# Patient Record
Sex: Female | Born: 1945 | Hispanic: No
Health system: Southern US, Community
[De-identification: ages and names within clinical notes are randomized; demographics above are authoritative.]

---

## 1998-11-22 ENCOUNTER — Other Ambulatory Visit: Admission: RE | Admit: 1998-11-22 | Discharge: 1998-11-22 | Payer: Self-pay | Admitting: Obstetrics and Gynecology

## 1998-12-12 ENCOUNTER — Ambulatory Visit (HOSPITAL_COMMUNITY): Admission: RE | Admit: 1998-12-12 | Discharge: 1998-12-12 | Payer: Self-pay | Admitting: Obstetrics and Gynecology

## 1998-12-12 ENCOUNTER — Encounter: Payer: Self-pay | Admitting: Obstetrics and Gynecology

## 2000-02-18 ENCOUNTER — Other Ambulatory Visit: Admission: RE | Admit: 2000-02-18 | Discharge: 2000-02-18 | Payer: Self-pay | Admitting: Obstetrics and Gynecology

## 2000-02-19 ENCOUNTER — Encounter: Payer: Self-pay | Admitting: Obstetrics and Gynecology

## 2000-02-19 ENCOUNTER — Ambulatory Visit (HOSPITAL_COMMUNITY): Admission: RE | Admit: 2000-02-19 | Discharge: 2000-02-19 | Payer: Self-pay | Admitting: Obstetrics and Gynecology

## 2001-11-11 ENCOUNTER — Encounter: Admission: RE | Admit: 2001-11-11 | Discharge: 2002-02-09 | Payer: Self-pay | Admitting: *Deleted

## 2002-12-20 ENCOUNTER — Encounter: Payer: Self-pay | Admitting: Emergency Medicine

## 2002-12-20 ENCOUNTER — Inpatient Hospital Stay (HOSPITAL_COMMUNITY): Admission: EM | Admit: 2002-12-20 | Discharge: 2002-12-28 | Payer: Self-pay | Admitting: Emergency Medicine

## 2002-12-20 ENCOUNTER — Encounter (INDEPENDENT_AMBULATORY_CARE_PROVIDER_SITE_OTHER): Payer: Self-pay | Admitting: *Deleted

## 2002-12-26 ENCOUNTER — Encounter: Payer: Self-pay | Admitting: General Surgery

## 2003-03-29 ENCOUNTER — Ambulatory Visit (HOSPITAL_COMMUNITY): Admission: RE | Admit: 2003-03-29 | Discharge: 2003-03-29 | Payer: Self-pay | Admitting: *Deleted

## 2003-03-29 ENCOUNTER — Encounter: Payer: Self-pay | Admitting: *Deleted

## 2005-03-26 ENCOUNTER — Ambulatory Visit (HOSPITAL_COMMUNITY): Admission: RE | Admit: 2005-03-26 | Discharge: 2005-03-26 | Payer: Self-pay | Admitting: Internal Medicine

## 2006-04-24 ENCOUNTER — Ambulatory Visit (HOSPITAL_COMMUNITY): Admission: RE | Admit: 2006-04-24 | Discharge: 2006-04-24 | Payer: Self-pay | Admitting: Internal Medicine

## 2007-08-06 ENCOUNTER — Ambulatory Visit (HOSPITAL_COMMUNITY): Admission: RE | Admit: 2007-08-06 | Discharge: 2007-08-06 | Payer: Self-pay | Admitting: Family Medicine

## 2011-04-12 NOTE — Op Note (Signed)
NAME:  Kristen Bennett, Kristen Bennett                           ACCOUNT NO.:  [id number]   MEDICAL RECORD NO.:  [id number]                   PATIENT TYPE:  INP   LOCATION:  1843                                 FACILITY:  [facility]   PHYSICIAN:  Abigail Miyamoto, M.D.              DATE OF BIRTH:  1946/04/12   DATE OF PROCEDURE:  12/20/2002  DATE OF DISCHARGE:                                 OPERATIVE REPORT   PREOPERATIVE DIAGNOSIS:  Perforated appendix with abscess.   POSTOPERATIVE DIAGNOSIS:  Perforated appendix with abscess.   PROCEDURES:  1. Open appendectomy.  2. Drainage of retroperitoneal abscess.   SURGEON:  Abigail Miyamoto, M.D.   ANESTHESIA:  General endotracheal anesthesia.   ESTIMATED BLOOD LOSS:  Minimal.   INDICATIONS FOR PROCEDURE:  The patient is a 65 year old female who  presented with a week long history of diffuse abdominal pain which then  localized to the right lower quadrant.  She has had no fevers, chills,  nausea, or vomiting.  Has had a normal appetite.  CAT scan of the abdomen  and pelvis revealed what appeared to be an enlarged appendix with an abscess  and large phlegmon in the right lower quadrant without any other  abnormalities.  The patient's preoperative white blood count was 12,000.   FINDINGS:  The patient was found to have a ruptured appendix with phlegmon  involving the tip of the cecum with an abscess stuck into the  retroperitoneum.   PROCEDURE IN DETAIL:  The patient brought to the operating room, identified  as herself.  She was placed upon the operating table and general anesthesia  was induced.  The abdomen was then prepped and draped in the usual sterile  fashion.  Using a #10 blade a longitudinal incision was made in the  patient's right lower quadrant.  The incision was carried down through  Scarpa's fascia to the external oblique fascia with electrocautery.  The  external oblique fascia was then opened with a scalpel and further with  Metzenbaum  scissors.  The underlying muscle layers were then fully dissected  free.  The peritoneum was then identified and opened with the Metzenbaum  scissors.  Upon entering the abdomen, the patient was found to have a hard  phlegmon fixed in the retroperitoneum.  Further finger dissection broke into  an abscess cavity and a large amount of foul-smelling pus was expressed into  the incision.  Cultures were obtained from this.  At this point, the  incision was extended much further medially and laterally.  After exploring  the abdomen the patient was found to have a large fixed abscess with  phlegmon from the ruptured appendix and the retroperitoneum on the right  lower quadrant.  The rest of the cecum actually appeared quite healthy and  soft as well as the terminal ileum.  The cecum was mobilized along with the  right colon along the white line  with electrocautery.  Finger fracturing and  dissection with electrocautery was used and finally able to bring the  abscess and ruptured appendix up into the incision.  What appeared to be an  appendiceal artery was clamped and tied off with silk ties.  Further finger  fracturing freed up the veil of ______ from the rest of the appendix and  mesoappendix.  Once the cecum and the appendix were finally elevated into  the wound with the rest of the contained abscess, a TA60 was brought onto  the field and brought across the base of the appendix and cecum.  The  terminal ileum and the ileocecal valve appeared quite free from the staple  line.  The whole appendix and cecal tip were then completely transected with  the scalpel and the stapler.  The staple line was then oversewn with  interrupted 3-0 silk sutures.  The repair appeared to be intact with soft  healthy tissue around the suture line.  At this point, the retroperitoneum  was thoroughly irrigated with copious amounts of normal saline.  A separate  skin incision was made with a #10 blade scalpel and 19  [place] Blake drain  was placed into the retroperitoneum where the abscess had been.  The Wentworth  drain was then replaced with a nylon drain.  At this point, the rest of the  abdomen and the right lower quadrant was explored and again hemostasis  appeared to be achieved.  The posterior fascia and peritoneum was then  closed with running 1 Novofil suture.  The anterior fascia was then likewise  closed with a running 1 Novofil suture.  The skin was then thoroughly  irrigated more.  Several skin staples were then placed and wicks were placed  down into the wound to help with the drainage.  Dry gauze was placed over  this.  The patient tolerated the procedure well.  All sponge, instrument,  and needle counts were correct at the end of the procedure.  The patient was  then extubated in the operating room and taken in stable condition to the  recovery room.                                                 Abigail Miyamoto, M.D.    DB/MEDQ  D:  12/20/2002  T:  12/20/2002  Job:  [id number]

## 2011-04-12 NOTE — Discharge Summary (Signed)
NAME:  Kristen Bennett, Kristen Bennett                           ACCOUNT NO.:  [id number]   MEDICAL RECORD NO.:  [id number]                   PATIENT TYPE:  INP   LOCATION:  5733                                 FACILITY:  [facility]   PHYSICIAN:  Abigail Miyamoto, M.D.              DATE OF BIRTH:  [birth year]   DATE OF ADMISSION:  12/20/2002  DATE OF DISCHARGE:  12/28/2002                                 DISCHARGE SUMMARY   DISCHARGE DIAGNOSES:  1. Perforated appendix with abscess, status post open appendectomy.  2. Type 2 diabetes mellitus.  3. Hypertension.   SUMMARY OF HISTORY:  The patient is a 65 year old female who presented with  a one-week history of right lower quadrant abdominal pain which was  intermittent in nature but began to become more diffuse and began radiating  to the left lower quadrant.  She had no nausea or vomiting with this and was  moving her bowels well.  She presented to the emergency department, where  she underwent a CAT scan of the abdomen and pelvis which showed her to have  a possible ruptured appendix with abscess.  At that time, she had a white  blood count also of 12.3 and on examination, had marked tenderness in her  right lower quadrant.   HOSPITAL COURSE:  Given the findings on CAT scan, decision was made to  proceed to the operating room where she underwent an open appendectomy as  well as drainage of a retroperitoneal abscess and therefore a Blake drain  was left in the wound.  She tolerated the procedure well and was taken in  stable condition to the regular surgical floor.  She was continued on IV  antibiotics.  By postop day 1, she was tolerating sips.  Cultures were  obtained at the time of the procedure and these were followed.  Her Blake  drain was draining only serosanguineous fluid.  By postoperative day 2, she  was having some mild temperatures but remained otherwise hemodynamically  stable, with the wound remaining clean.  Over the next several days,  she  continued to have low-grade temperatures and her white blood count continued  to return to normal and she was continued on her normal antibiotic course.  Her ileus quickly resolved and she was advanced to a regular diet.  Her  diabetes was controlled with both her home oral medications and a on sliding  scale.  By postoperative day 6, her blood sugars still remained in the 159  to 260 range and her sliding scales of insulin were adjusted accordingly.  By postoperative day 7, she was continuing to have improvement in her bowel  movements and her drain remained minimal.  Followup CAT scan of the abdomen  and pelvis showed no abscess but a persistent phlegmon.  The pathology did  reveal appendicitis without any evidence of malignancy.  At this point, the  patient's Harrison Mons drain was  removed and she was converted to oral antibiotics.  By postoperative day 8, she was doing well, her abdomen was soft, she was  afebrile and decision was made to discharge the patient to home.   DISCHARGE MEDICATIONS:  She will resume all her home medications.  She is  going to take Percocet and Advil for pain.  She will take Augmentin 875 mg  b.i.d.   ACTIVITY:  She should do no heavy lifting greater than 20 pounds for 4 to 5  weeks.   WOUND CARE:  She may shower.    DISCHARGE FOLLOWUP:  She will follow up in the office of Dr. Abigail Miyamoto in two weeks post discharge.                                                Abigail Miyamoto, M.D.    DB/MEDQ  D:  02/01/2003  T:  02/02/2003  Job:  [id number]

## 2014-05-09 ENCOUNTER — Other Ambulatory Visit (HOSPITAL_COMMUNITY): Payer: Self-pay | Admitting: Family Medicine

## 2014-05-09 DIAGNOSIS — Z1231 Encounter for screening mammogram for malignant neoplasm of breast: Secondary | ICD-10-CM

## 2014-05-17 ENCOUNTER — Ambulatory Visit (HOSPITAL_COMMUNITY)
Admission: RE | Admit: 2014-05-17 | Discharge: 2014-05-17 | Disposition: A | Discharge status: Home / Self Care | Payer: BC Managed Care – PPO | Source: Ambulatory Visit | Attending: Family Medicine | Admitting: Family Medicine

## 2014-05-17 ENCOUNTER — Ambulatory Visit (HOSPITAL_COMMUNITY): Payer: Self-pay

## 2014-05-17 DIAGNOSIS — Z1231 Encounter for screening mammogram for malignant neoplasm of breast: Secondary | ICD-10-CM

## 2016-11-29 ENCOUNTER — Other Ambulatory Visit: Payer: Self-pay | Admitting: Family Medicine

## 2016-11-29 DIAGNOSIS — Z1231 Encounter for screening mammogram for malignant neoplasm of breast: Secondary | ICD-10-CM

## 2017-01-03 ENCOUNTER — Encounter: Payer: Medicare Other | Attending: Family Medicine | Admitting: Registered"

## 2017-01-03 ENCOUNTER — Ambulatory Visit
Admission: RE | Admit: 2017-01-03 | Discharge: 2017-01-03 | Disposition: A | Discharge status: Home / Self Care | Payer: Medicare Other | Source: Ambulatory Visit | Attending: Family Medicine | Admitting: Family Medicine

## 2017-01-03 DIAGNOSIS — Z713 Dietary counseling and surveillance: Secondary | ICD-10-CM | POA: Insufficient documentation

## 2017-01-03 DIAGNOSIS — E119 Type 2 diabetes mellitus without complications: Secondary | ICD-10-CM | POA: Insufficient documentation

## 2017-01-03 DIAGNOSIS — Z1231 Encounter for screening mammogram for malignant neoplasm of breast: Secondary | ICD-10-CM

## 2017-01-03 NOTE — Patient Instructions (Addendum)
Keep up the exercise Avoid skipping meals Oatmeal and nuts would be a good breakfast Include carbohydrate and protein with meals and snacks Check BG 2 hours after a meal (goal 100 -180)

## 2017-01-03 NOTE — Progress Notes (Signed)
Diabetes Self-Management Education  Visit Type: First/Initial  Appt. Start Time: 0800 Appt. End Time: 0915  01/03/2017  Ms. Kristen Bennett, identified by name and date of birth, is a 71 y.o. female with a diagnosis of Diabetes: Type 2.   ASSESSMENT Patient reports she started a low carb/intermittent fasting diet to lose weight 6 weeks ago. Diabetes medications include metformin, glimipiride, and also started Januvia recently. Because patient reports daily exercise and low food intake, stressed importance for regular meals and including some carb and protein with meals to avoid low blood sugar.      Diabetes Self-Management Education - 01/03/17 0826      Visit Information   Visit Type First/Initial     Initial Visit   Diabetes Type Type 2   Are you currently following a meal plan? Yes   What type of meal plan do you follow? last 6 weeks has been doing a low carb/intermittent fasting to lose wt   Are you taking your medications as prescribed? Yes   Date Diagnosed 16 yrs ago     Health Coping   How would you rate your overall health? Excellent     Psychosocial Assessment   Patient Belief/Attitude about Diabetes Motivated to manage diabetes   Preferred Learning Style No preference indicated   How often do you need to have someone help you when you read instructions, pamphlets, or other written materials from your doctor or pharmacy? 1 - Never   What is the last grade level you completed in school? 3 yrs of college     Complications   Last HgB A1C per patient/outside source 9.6 %  jan 2018   How often do you check your blood sugar? 1-2 times/day  before breakfast   Fasting Blood glucose range (mg/dL) --  [phone]   Number of hypoglycemic episodes per month 0  77 felt low per pt   Have you had a dilated eye exam in the past 12 months? No  has an appointment   Have you had a dental exam in the past 12 months? Yes   Are you checking your feet? Yes   How many days per week are you  checking your feet? 7     Dietary Intake   Breakfast boiled egg OR none   Lunch protein, vegetable   Snack (afternoon) nuts   Dinner protein, vegetable   Snack (evening) --  no food after 6 pm   Beverage(s) diet soda, coffee, water (1/2 gal per day)     Exercise   Exercise Type Light (walking / raking leaves)   How many days per week to you exercise? 7   How many minutes per day do you exercise? 30   Total minutes per week of exercise 210     Patient Education   Previous Diabetes Education Yes (please comment)  16 yrs ago DSME classes   Disease state  Definition of diabetes, type 1 and 2, and the diagnosis of diabetes   Nutrition management  Reviewed blood glucose goals for pre and post meals and how to evaluate the patients' food intake on their blood glucose level.;Meal timing in regards to the patients' current diabetes medication.   Physical activity and exercise  Role of exercise on diabetes management, blood pressure control and cardiac health.   Monitoring Identified appropriate SMBG and/or A1C goals.;Daily foot exams;Purpose and frequency of SMBG.;Yearly dilated eye exam   Acute complications Taught treatment of hypoglycemia - the 15 rule.   Chronic complications  Relationship between chronic complications and blood glucose control     Individualized Goals (developed by patient)   Nutrition General guidelines for healthy choices and portions discussed   Monitoring  test blood glucose pre and post meals as discussed     Outcomes   Expected Outcomes Demonstrated interest in learning. Expect positive outcomes   Future DMSE Other (comment)  1 week to continue initial education   Program Status Not Completed      Individualized Plan for Diabetes Self-Management Training:   Learning Objective:  Patient will have a greater understanding of diabetes self-management. Patient education plan is to attend individual and/or group sessions per assessed needs and concerns.   Plan:    Patient Instructions  Keep up the exercise Avoid skipping meals Oatmeal and nuts would be a good breakfast Include carbohydrate and protein with meals and snacks Check BG 2 hours after a meal (goal 100 -180)   Expected Outcomes:  Demonstrated interest in learning. Expect positive outcomes  Education material provided: Living Well with Diabetes copy  If problems or questions, patient to contact team via:  Phone and Email  Future DSME appointment: Other (comment) (1 week to continue initial education)

## 2017-01-10 ENCOUNTER — Encounter: Payer: Medicare Other | Admitting: Registered"

## 2017-01-10 DIAGNOSIS — Z713 Dietary counseling and surveillance: Secondary | ICD-10-CM | POA: Diagnosis not present

## 2017-01-10 DIAGNOSIS — E119 Type 2 diabetes mellitus without complications: Secondary | ICD-10-CM

## 2017-01-10 NOTE — Patient Instructions (Signed)
Plan:  Aim for 2-3 Carb Choices per meal (30-45 grams) +/- 1 either way  Aim for 0-1 Carbs per snack if hungry  Include protein in moderation with your meals and snacks Consider reading food labels for Total Carbohydrate Continue your activity level daily as tolerated Consider checking BG at alternate times per day as directed by MD  Continue taking medication as directed by MD

## 2017-01-10 NOTE — Progress Notes (Signed)
Visit Type: Follow-up to continue diabetes education  Appt. Start Time: 0800            Appt. End Time: 0900  01/10/2017  Ms. Kristen Bennett Bennett, identified by name and date of birth, is a 71 y.o. female with a diagnosis of Diabetes: Type 2.   ASSESSMENT Patient reports she started having oatmeal with walnuts for breakfast and feels much better. Will go in to see her doctor in April to have her A1c tested again.  Breakfast: oatmeal, walnuts Snack: nuts Lunch: 1 slice whole grain bread with meat or egg Dinner: shrimp, cauliflower, broccoli  Physical Activity: 30 min walking every day  Individualized Plan for Diabetes Self-Management Training:   Plan:  Aim for 2-3 Carb Choices per meal (30-45 grams) +/- 1 either way  Aim for 0-1 Carbs per snack if hungry  Include protein in moderation with your meals and snacks Consider reading food labels for Total Carbohydrate Continue your activity level daily as tolerated Consider checking BG at alternate times per day as directed by MD  Continue taking medication as directed by MD   Education material provided:   Meal Plan Card  Portion Plate  Snack Suggestions   If problems or questions, patient to contact team via:  Phone and Email  Future DSME appointment: PRN

## 2018-02-09 ENCOUNTER — Other Ambulatory Visit: Payer: Self-pay | Admitting: Family Medicine

## 2018-02-09 DIAGNOSIS — Z1231 Encounter for screening mammogram for malignant neoplasm of breast: Secondary | ICD-10-CM

## 2018-03-05 ENCOUNTER — Ambulatory Visit
Admission: RE | Admit: 2018-03-05 | Discharge: 2018-03-05 | Disposition: A | Discharge status: Home / Self Care | Payer: Medicare Other | Source: Ambulatory Visit | Attending: Family Medicine | Admitting: Family Medicine

## 2018-03-05 DIAGNOSIS — Z1231 Encounter for screening mammogram for malignant neoplasm of breast: Secondary | ICD-10-CM

## 2018-06-30 ENCOUNTER — Other Ambulatory Visit: Payer: Self-pay | Admitting: Family Medicine

## 2018-07-03 ENCOUNTER — Other Ambulatory Visit: Payer: Self-pay | Admitting: Family Medicine

## 2018-07-03 DIAGNOSIS — R42 Dizziness and giddiness: Secondary | ICD-10-CM

## 2018-07-03 DIAGNOSIS — I159 Secondary hypertension, unspecified: Secondary | ICD-10-CM

## 2018-07-14 ENCOUNTER — Ambulatory Visit
Admission: RE | Admit: 2018-07-14 | Discharge: 2018-07-14 | Disposition: A | Discharge status: Home / Self Care | Payer: Medicare Other | Source: Ambulatory Visit | Attending: Family Medicine | Admitting: Family Medicine

## 2018-07-14 DIAGNOSIS — I159 Secondary hypertension, unspecified: Secondary | ICD-10-CM

## 2018-07-14 DIAGNOSIS — R42 Dizziness and giddiness: Secondary | ICD-10-CM

## 2018-08-12 ENCOUNTER — Encounter: Payer: Self-pay | Admitting: Rehabilitative and Restorative Service Providers"

## 2018-08-12 ENCOUNTER — Ambulatory Visit: Payer: Medicare Other | Attending: Family Medicine | Admitting: Rehabilitative and Restorative Service Providers"

## 2018-08-12 DIAGNOSIS — R2689 Other abnormalities of gait and mobility: Secondary | ICD-10-CM | POA: Insufficient documentation

## 2018-08-12 DIAGNOSIS — H8112 Benign paroxysmal vertigo, left ear: Secondary | ICD-10-CM | POA: Insufficient documentation

## 2018-08-13 NOTE — Therapy (Signed)
[facility] [address] [place], [place], [address] Phone: [phone]   Fax:  [phone]  Physical Therapy Evaluation  Patient Details  Name: Kristen Bennett MRN: [id number] Date of Birth: [birth year] Referring Provider: Sigmund HazelLisa Miller, MD   Encounter Date: 08/12/2018  PT End of Session - 08/13/18 0912    Visit Number  1    Number of Visits  4    Date for PT Re-Evaluation  09/12/18    Authorization Type  $20 copay [employer] medicare    PT Start Time  1235    PT Stop Time  1315    PT Time Calculation (min)  40 min       History reviewed. No pertinent past medical history.  History reviewed. No pertinent surgical history.  There were no vitals filed for this visit.   Subjective Assessment - 08/12/18 1238    Subjective  The patient reports that she had a bout of vertigo 3.5 years ago that lasted for 1-2 days intermittently.  In the middle of June, she got a slight sensation of vertigo in the morning when getting up.  At the end of June, she had a more severe sensation of spinning in the morning when she first got up.  She was prescribed meclizine, which seems to help. Patient reports fear that dizziness can come on.    Symptoms are worse when moving to the left side.    Pertinent History  HTN, hypercolesterolemia, type II diabetes.      Patient Stated Goals  Get rid of this vertigo.  "I'm real careful about movements."    Currently in Pain?  No/denies         [facility] PT Assessment - 08/12/18 1242      Assessment   Medical Diagnosis  vertigo    Referring Provider  Sigmund HazelLisa Miller, MD    Onset Date/Surgical Date  --   04/2018   Prior Therapy  none      Precautions   Precautions  Fall      Restrictions   Weight Bearing Restrictions  No      Balance Screen   Has the patient fallen in the past 6 months  Yes    How many times?  1; fell 2 days before first onset and slipped on loose gravel and skinned knees and elbows "it  was a hard fall."    Has the patient had a decrease in activity level because of a fear of falling?   No    Is the patient reluctant to leave their home because of a fear of falling?   No      Home Environment   Living Environment  Private residence    Living Arrangements  Alone    Type of Home  House    Home Access  Level entry    Home Layout  One level      Prior Function   Level of Independence  Independent           Vestibular Assessment - 08/12/18 1244      Vestibular Assessment   General Observation  Patient notes some nausea/vomitting and sensation of falling with vertigo sensation.  No dizziness at rest.      Symptom Behavior   Type of Dizziness  Spinning    Frequency of Dizziness  heppens every day when getting into/out of bed    Duration of Dizziness  seconds    Aggravating Factors  Activity in  general;Turning head quickly;Supine to sit;Rolling to right;Rolling to left    Relieving Factors  Head stationary;Medication      Occulomotor Exam   Occulomotor Alignment  Normal    Spontaneous  Absent    Gaze-induced  Absent    Smooth Pursuits  Intact    Saccades  Intact      Vestibulo-Occular Reflex   VOR 1 Head Only (x 1 viewing)  slow gaze intact for visual fixation      Positional Testing   Dix-Hallpike  Dix-Hallpike Right;Dix-Hallpike Left    Horizontal Canal Testing  Horizontal Canal Right;Horizontal Canal Left      Dix-Hallpike Right   Dix-Hallpike Right Duration  0    Dix-Hallpike Right Symptoms  No nystagmus      Dix-Hallpike Left   Dix-Hallpike Left Duration  10    Dix-Hallpike Left Symptoms  Upbeat, left rotatory nystagmus   large amplitude nystagmus     Horizontal Canal Right   Horizontal Canal Right Duration  0    Horizontal Canal Right Symptoms  Normal      Horizontal Canal Left   Horizontal Canal Left Duration  0    Horizontal Canal Left Symptoms  Normal          Objective measurements completed on examination: See above findings.        Vestibular Treatment/Exercise - 08/12/18 1252      Vestibular Treatment/Exercise   Vestibular Treatment Provided  Canalith Repositioning    Canalith Repositioning  Epley Manuever Left            PT Education - 08/13/18 0911    Education Details  nature of BPPV, handout from [email or web address]    Person(s) Educated  Patient    Methods  Explanation;Handout    Comprehension  Verbalized understanding          PT Long Term Goals - 08/13/18 0912      PT LONG TERM GOAL #1   Title  The patient will have negative position testing indicating resolution of BPPv.    Time  4    Period  Weeks    Target Date  09/12/18      PT LONG TERM GOAL #2   Title  The patient will subjectively report ability to perform bed mobility without vertigo.    Time  4    Period  Weeks    Target Date  09/12/18      PT LONG TERM GOAL #3   Title  The patient will be instructed in home mgmt of positional vertigo.    Time  4    Period  Weeks    Target Date  09/12/18      PT LONG TERM GOAL #4   Title  The patient will be further assessed for gait/balance once BPPv cleared and exercises to follow, if indicated.    Time  4    Period  Weeks    Target Date  09/12/18             Plan - 08/13/18 0913    Clinical Impression Statement  The patient is a 72 year old female presenting to OP phsyical therapy with left posterior canal BPPV responding well to Epley's canolith repositioning today.  PT to f/u for reassessment to ensure resolution of vertigo and further assessment of balance/gait if indicated.     History and Personal Factors relevant to plan of care:  HTN, hypercolesterolemia, type II diabetes.      Clinical Presentation  Stable  Clinical Decision Making  Low    Rehab Potential  Good    PT Frequency  1x / week    PT Duration  4 weeks    PT Treatment/Interventions  ADLs/Self Care Home Management;Balance training;Neuromuscular re-education;Vestibular;Canalith Repostioning;Gait  training;Stair training;Functional mobility training;Therapeutic activities;Therapeutic exercise;Patient/family education    PT Next Visit Plan  reassess for positional vertigo, treat as indicated, home mgmt, assess balance if needed.    Consulted and Agree with Plan of Care  Patient       Patient will benefit from skilled therapeutic intervention in order to improve the following deficits and impairments:  Abnormal gait, Dizziness, Decreased balance, Decreased activity tolerance  Visit Diagnosis: BPPV (benign paroxysmal positional vertigo), left  Other abnormalities of gait and mobility     Problem List There are no active problems to display for this patient.   Ely Spragg, PT 08/13/2018, 9:15 AM  [facility] [address] [place], [place], [address] Phone: [phone]   Fax:  [phone]  Name: Kristen Bennett MRN: [id number] Date of Birth: Dec 09, 1945

## 2018-08-17 ENCOUNTER — Ambulatory Visit: Payer: Medicare Other | Admitting: Rehabilitative and Restorative Service Providers"

## 2018-08-24 ENCOUNTER — Encounter: Payer: Self-pay | Admitting: Rehabilitative and Restorative Service Providers"

## 2018-08-24 ENCOUNTER — Ambulatory Visit: Payer: Medicare Other | Admitting: Rehabilitative and Restorative Service Providers"

## 2018-08-24 DIAGNOSIS — H8112 Benign paroxysmal vertigo, left ear: Secondary | ICD-10-CM

## 2018-08-24 DIAGNOSIS — R2689 Other abnormalities of gait and mobility: Secondary | ICD-10-CM

## 2018-08-24 NOTE — Therapy (Signed)
[facility] [address] [place], [place], [address] Phone: [phone]   Fax:  [phone]  Physical Therapy Treatment and Discharge Summary  Patient Details  Name: Kristen Bennett MRN: [id number] Date of Birth: [birth year] Referring Provider (PT): Kathyrn Lass, MD   Encounter Date: 08/24/2018  PT End of Session - 08/24/18 1124    Visit Number  2    Number of Visits  4    Date for PT Re-Evaluation  09/12/18    Authorization Type  $20 copay [employer] medicare    PT Start Time  1100    PT Stop Time  1118    PT Time Calculation (min)  18 min    Activity Tolerance  Patient tolerated treatment well    Behavior During Therapy  [facility] for tasks assessed/performed       History reviewed. No pertinent past medical history.  History reviewed. No pertinent surgical history.  There were no vitals filed for this visit.  Subjective Assessment - 08/24/18 1104    Subjective  The patient reports no recurrence of vertigo since evaluation.    She reports she is still careful or cautious with movement due to developing a pattern.  She has a cold and is concerned this may influence how she feels today.    Patient Stated Goals  Get rid of this vertigo.  "I'm real careful about movements."    Currently in Pain?  No/denies             Vestibular Assessment - 08/24/18 1124      Positional Testing   Dix-Hallpike  Dix-Hallpike Right;Dix-Hallpike Left      Dix-Hallpike Left   Dix-Hallpike Left Duration  0    Dix-Hallpike Left Symptoms  No nystagmus               [facility] Adult PT Treatment/Exercise - 08/24/18 1125      Self-Care   Self-Care  Other Self-Care Comments    Other Self-Care Comments   Educated patient on brandt daroff habituation exercises, how it works and frequency needed to perform.  Also discussed to only begin if symptoms return.  Patient verbalizes understanding.                  PT Long Term Goals  - 08/24/18 1126      PT LONG TERM GOAL #1   Title  The patient will have negative position testing indicating resolution of BPPv.    Time  4    Period  Weeks    Status  Achieved      PT LONG TERM GOAL #2   Title  The patient will subjectively report ability to perform bed mobility without vertigo.    Time  4    Period  Weeks    Status  Achieved      PT LONG TERM GOAL #3   Title  The patient will be instructed in home mgmt of positional vertigo.    Time  4    Period  Weeks    Status  Achieved      PT LONG TERM GOAL #4   Title  The patient will be further assessed for gait/balance once BPPv cleared and exercises to follow, if indicated.    Time  4    Period  Weeks    Status  Deferred            Plan - 08/24/18 1126    Clinical Impression Statement  The patient met LTGs noting no further dizziness since initial treatment.  PT provided instruction in self mgmt of symptoms due to h/o 2 prior episodes.     PT Treatment/Interventions  ADLs/Self Care Home Management;Balance training;Neuromuscular re-education;Vestibular;Canalith Repostioning;Gait training;Stair training;Functional mobility training;Therapeutic activities;Therapeutic exercise;Patient/family education    PT Next Visit Plan  Discharge today- symptoms resolved    Consulted and Agree with Plan of Care  Patient       Patient will benefit from skilled therapeutic intervention in order to improve the following deficits and impairments:  Abnormal gait, Dizziness, Decreased balance, Decreased activity tolerance  Visit Diagnosis: BPPV (benign paroxysmal positional vertigo), left  Other abnormalities of gait and mobility    PHYSICAL THERAPY DISCHARGE SUMMARY  Visits from Start of Care: 2  Current functional level related to goals / functional outcomes: See above   Remaining deficits: None    Education / Equipment: HEP on self mgmt of BPPv  Plan: Patient agrees to discharge.  Patient goals were met. Patient  is being discharged due to meeting the stated rehab goals.  ?????        Thank you for the referral of this patient. Rudell Cobb, MPT   [place], PT 08/24/2018, 11:26 AM  [facility] [address] [place], [place], [address] Phone: [phone]   Fax:  [phone]  Name: PRESLEIGH FELDSTEIN MRN: [id number] Date of Birth: 24-Aug-1946

## 2018-08-24 NOTE — Patient Instructions (Signed)
Habituation - Tip Card  1.The goal of habituation training is to assist in decreasing symptoms of vertigo, dizziness, or nausea provoked by specific head and body motions. 2.These exercises may initially increase symptoms; however, be persistent and work through symptoms. With repetition and time, the exercises will assist in reducing or eliminating symptoms. 3.Exercises should be stopped and discussed with the therapist if you experience any of the following: - Sudden change or fluctuation in hearing - New onset of ringing in the ears, or increase in current intensity - Any fluid discharge from the ear - Severe pain in neck or back - Extreme nausea  Copyright  VHI. All rights reserved.   Habituation - Sit to Side-Lying   Sit on edge of bed. Lie down onto the right side and hold until dizziness stops, plus 20 seconds.  Return to sitting and wait until dizziness stops, plus 20 seconds.  Repeat to the left side. Repeat sequence 5 times per session. Do 2 sessions per day.  Copyright  VHI. All rights reserved.     

## 2019-04-16 ENCOUNTER — Other Ambulatory Visit: Payer: Self-pay | Admitting: Family Medicine

## 2019-04-16 DIAGNOSIS — Z1231 Encounter for screening mammogram for malignant neoplasm of breast: Secondary | ICD-10-CM

## 2019-05-14 ENCOUNTER — Other Ambulatory Visit: Payer: Self-pay

## 2019-05-14 ENCOUNTER — Ambulatory Visit
Admission: RE | Admit: 2019-05-14 | Discharge: 2019-05-14 | Disposition: A | Discharge status: Home / Self Care | Payer: Medicare Other | Source: Ambulatory Visit | Attending: Family Medicine | Admitting: Family Medicine

## 2019-05-14 DIAGNOSIS — Z1231 Encounter for screening mammogram for malignant neoplasm of breast: Secondary | ICD-10-CM

## 2020-04-26 ENCOUNTER — Other Ambulatory Visit: Payer: Self-pay | Admitting: Family Medicine

## 2020-04-26 DIAGNOSIS — Z1231 Encounter for screening mammogram for malignant neoplasm of breast: Secondary | ICD-10-CM

## 2020-05-17 ENCOUNTER — Ambulatory Visit
Admission: RE | Admit: 2020-05-17 | Discharge: 2020-05-17 | Disposition: A | Discharge status: Home / Self Care | Payer: Medicare Other | Source: Ambulatory Visit | Attending: Family Medicine | Admitting: Family Medicine

## 2020-05-17 ENCOUNTER — Other Ambulatory Visit: Payer: Self-pay

## 2020-05-17 DIAGNOSIS — Z1231 Encounter for screening mammogram for malignant neoplasm of breast: Secondary | ICD-10-CM

## 2020-07-05 DIAGNOSIS — Z79899 Other long term (current) drug therapy: Secondary | ICD-10-CM | POA: Diagnosis not present

## 2020-07-05 DIAGNOSIS — B351 Tinea unguium: Secondary | ICD-10-CM | POA: Diagnosis not present

## 2020-08-13 DIAGNOSIS — Z20828 Contact with and (suspected) exposure to other viral communicable diseases: Secondary | ICD-10-CM | POA: Diagnosis not present

## 2020-08-15 ENCOUNTER — Other Ambulatory Visit (HOSPITAL_COMMUNITY): Payer: Self-pay | Admitting: Nurse Practitioner

## 2020-08-15 DIAGNOSIS — U071 COVID-19: Secondary | ICD-10-CM

## 2020-08-15 NOTE — Progress Notes (Signed)
I connected by phone with Kristen Bennett on 08/15/2020 at 7:06 PM to discuss the potential use of a new treatment for mild to moderate COVID-19 viral infection in non-hospitalized patients.  This patient is a 74 y.o. female that meets the FDA criteria for Emergency Use Authorization of COVID monoclonal antibody casirivimab/imdevimab.  Has a (+) direct SARS-CoV-2 viral test result  Has mild or moderate COVID-19   Is NOT hospitalized due to COVID-19  Is within 10 days of symptom onset  Has at least one of the high risk factor(s) for progression to severe COVID-19 and/or hospitalization as defined in EUA.  Specific high risk criteria : Diabetes   I have spoken and communicated the following to the patient or parent/caregiver regarding COVID monoclonal antibody treatment:  1. FDA has authorized the emergency use for the treatment of mild to moderate COVID-19 in adults and pediatric patients with positive results of direct SARS-CoV-2 viral testing who are 47 years of age and older weighing at least 40 kg, and who are at high risk for progressing to severe COVID-19 and/or hospitalization.  2. The significant known and potential risks and benefits of COVID monoclonal antibody, and the extent to which such potential risks and benefits are unknown.  3. Information on available alternative treatments and the risks and benefits of those alternatives, including clinical trials.  4. Patients treated with COVID monoclonal antibody should continue to self-isolate and use infection control measures (e.g., wear mask, isolate, social distance, avoid sharing personal items, clean and disinfect "high touch" surfaces, and frequent handwashing) according to CDC guidelines.   5. The patient or parent/caregiver has the option to accept or refuse COVID monoclonal antibody treatment.  After reviewing this information with the patient, The patient agreed to proceed with receiving casirivimab\imdevimab infusion and  will be provided a copy of the Fact sheet prior to receiving the infusion. Infusion scheduled for September 22 at 3:30pm.   Trinidad Curet, NP 08/15/2020 7:06 PM

## 2020-08-16 ENCOUNTER — Ambulatory Visit (HOSPITAL_COMMUNITY)
Admission: RE | Admit: 2020-08-16 | Discharge: 2020-08-16 | Disposition: A | Discharge status: Home / Self Care | Payer: Medicare Other | Source: Ambulatory Visit | Attending: Pulmonary Disease | Admitting: Pulmonary Disease

## 2020-08-16 ENCOUNTER — Other Ambulatory Visit (HOSPITAL_COMMUNITY): Payer: Self-pay

## 2020-08-16 DIAGNOSIS — Z23 Encounter for immunization: Secondary | ICD-10-CM | POA: Insufficient documentation

## 2020-08-16 DIAGNOSIS — U071 COVID-19: Secondary | ICD-10-CM

## 2020-08-16 MED ORDER — SODIUM CHLORIDE 0.9 % IV SOLN
1200.0000 mg | Freq: Once | INTRAVENOUS | Status: AC
  Administered 2020-08-16: 1200 mg via INTRAVENOUS

## 2020-08-16 MED ORDER — DIPHENHYDRAMINE HCL 50 MG/ML IJ SOLN: 50.0000 mg | Freq: Once | INTRAMUSCULAR | Status: DC | PRN

## 2020-08-16 MED ORDER — METHYLPREDNISOLONE SODIUM SUCC 125 MG IJ SOLR: 125.0000 mg | Freq: Once | INTRAMUSCULAR | Status: DC | PRN

## 2020-08-16 MED ORDER — FAMOTIDINE IN NACL 20-0.9 MG/50ML-% IV SOLN: 20.0000 mg | Freq: Once | INTRAVENOUS | Status: DC | PRN

## 2020-08-16 MED ORDER — SODIUM CHLORIDE 0.9 % IV SOLN: INTRAVENOUS | Status: DC | PRN

## 2020-08-16 MED ORDER — ALBUTEROL SULFATE HFA 108 (90 BASE) MCG/ACT IN AERS: 2.0000 | INHALATION_SPRAY | Freq: Once | RESPIRATORY_TRACT | Status: DC | PRN

## 2020-08-16 MED ORDER — EPINEPHRINE 0.3 MG/0.3ML IJ SOAJ: 0.3000 mg | Freq: Once | INTRAMUSCULAR | Status: DC | PRN

## 2020-08-16 NOTE — Discharge Instructions (Signed)

## 2020-08-16 NOTE — Progress Notes (Signed)
  Diagnosis: COVID-19  Physician: Dr. Patrick Wright  Procedure: [facility] Infusion [facility] Med: casirivimab\imdevimab infusion - Provided patient with casirivimab\imdevimab fact sheet for patients, parents and caregivers prior to infusion.  Complications: No immediate complications noted.  Discharge: Discharged home   Kalsey Lull H Anberlyn Feimster 08/16/2020   

## 2020-10-04 DIAGNOSIS — E1169 Type 2 diabetes mellitus with other specified complication: Secondary | ICD-10-CM | POA: Diagnosis not present

## 2020-10-04 DIAGNOSIS — Z780 Asymptomatic menopausal state: Secondary | ICD-10-CM | POA: Diagnosis not present

## 2020-10-04 DIAGNOSIS — Z6836 Body mass index (BMI) 36.0-36.9, adult: Secondary | ICD-10-CM | POA: Diagnosis not present

## 2020-10-04 DIAGNOSIS — E782 Mixed hyperlipidemia: Secondary | ICD-10-CM | POA: Diagnosis not present

## 2020-10-04 DIAGNOSIS — Z0000 Encounter for general adult medical examination without abnormal findings: Secondary | ICD-10-CM | POA: Diagnosis not present

## 2020-10-04 DIAGNOSIS — I10 Essential (primary) hypertension: Secondary | ICD-10-CM | POA: Diagnosis not present

## 2020-10-27 ENCOUNTER — Other Ambulatory Visit: Payer: Self-pay | Admitting: Family Medicine

## 2020-10-31 DIAGNOSIS — I10 Essential (primary) hypertension: Secondary | ICD-10-CM | POA: Diagnosis not present

## 2020-10-31 DIAGNOSIS — E1169 Type 2 diabetes mellitus with other specified complication: Secondary | ICD-10-CM | POA: Diagnosis not present

## 2020-10-31 DIAGNOSIS — E782 Mixed hyperlipidemia: Secondary | ICD-10-CM | POA: Diagnosis not present

## 2020-12-08 DIAGNOSIS — M533 Sacrococcygeal disorders, not elsewhere classified: Secondary | ICD-10-CM | POA: Diagnosis not present

## 2020-12-08 DIAGNOSIS — Z6836 Body mass index (BMI) 36.0-36.9, adult: Secondary | ICD-10-CM | POA: Diagnosis not present

## 2021-01-23 DIAGNOSIS — H2513 Age-related nuclear cataract, bilateral: Secondary | ICD-10-CM | POA: Diagnosis not present

## 2021-01-23 DIAGNOSIS — H524 Presbyopia: Secondary | ICD-10-CM | POA: Diagnosis not present

## 2021-03-30 DIAGNOSIS — E559 Vitamin D deficiency, unspecified: Secondary | ICD-10-CM | POA: Diagnosis not present

## 2021-03-30 DIAGNOSIS — R946 Abnormal results of thyroid function studies: Secondary | ICD-10-CM | POA: Diagnosis not present

## 2021-03-30 DIAGNOSIS — E1169 Type 2 diabetes mellitus with other specified complication: Secondary | ICD-10-CM | POA: Diagnosis not present

## 2021-03-30 DIAGNOSIS — E782 Mixed hyperlipidemia: Secondary | ICD-10-CM | POA: Diagnosis not present

## 2021-03-30 DIAGNOSIS — E538 Deficiency of other specified B group vitamins: Secondary | ICD-10-CM | POA: Diagnosis not present

## 2021-03-30 DIAGNOSIS — I10 Essential (primary) hypertension: Secondary | ICD-10-CM | POA: Diagnosis not present

## 2021-03-30 DIAGNOSIS — Z6836 Body mass index (BMI) 36.0-36.9, adult: Secondary | ICD-10-CM | POA: Diagnosis not present

## 2021-05-14 ENCOUNTER — Other Ambulatory Visit: Payer: Self-pay | Admitting: Family Medicine

## 2021-05-16 ENCOUNTER — Other Ambulatory Visit: Payer: Self-pay | Admitting: Family Medicine

## 2021-05-16 DIAGNOSIS — Z1231 Encounter for screening mammogram for malignant neoplasm of breast: Secondary | ICD-10-CM

## 2021-05-16 DIAGNOSIS — Z780 Asymptomatic menopausal state: Secondary | ICD-10-CM

## 2021-06-26 ENCOUNTER — Ambulatory Visit: Payer: Medicare Other

## 2021-06-26 ENCOUNTER — Ambulatory Visit
Admission: RE | Admit: 2021-06-26 | Discharge: 2021-06-26 | Disposition: A | Discharge status: Home / Self Care | Payer: Medicare Other | Source: Ambulatory Visit | Attending: Family Medicine | Admitting: Family Medicine

## 2021-06-26 DIAGNOSIS — M85832 Other specified disorders of bone density and structure, left forearm: Secondary | ICD-10-CM | POA: Diagnosis not present

## 2021-06-26 DIAGNOSIS — Z780 Asymptomatic menopausal state: Secondary | ICD-10-CM | POA: Diagnosis not present

## 2021-06-27 ENCOUNTER — Other Ambulatory Visit: Payer: Self-pay

## 2021-08-16 ENCOUNTER — Ambulatory Visit
Admission: RE | Admit: 2021-08-16 | Discharge: 2021-08-16 | Disposition: A | Discharge status: Home / Self Care | Payer: Medicare PPO | Source: Ambulatory Visit | Attending: Family Medicine | Admitting: Family Medicine

## 2021-08-16 ENCOUNTER — Other Ambulatory Visit: Payer: Self-pay

## 2021-08-16 DIAGNOSIS — Z1231 Encounter for screening mammogram for malignant neoplasm of breast: Secondary | ICD-10-CM | POA: Diagnosis not present

## 2021-10-02 DIAGNOSIS — I10 Essential (primary) hypertension: Secondary | ICD-10-CM | POA: Diagnosis not present

## 2021-10-02 DIAGNOSIS — E782 Mixed hyperlipidemia: Secondary | ICD-10-CM | POA: Diagnosis not present

## 2021-10-02 DIAGNOSIS — E1169 Type 2 diabetes mellitus with other specified complication: Secondary | ICD-10-CM | POA: Diagnosis not present

## 2021-12-31 DIAGNOSIS — Z1389 Encounter for screening for other disorder: Secondary | ICD-10-CM | POA: Diagnosis not present

## 2021-12-31 DIAGNOSIS — Z0000 Encounter for general adult medical examination without abnormal findings: Secondary | ICD-10-CM | POA: Diagnosis not present

## 2022-01-25 DIAGNOSIS — H2513 Age-related nuclear cataract, bilateral: Secondary | ICD-10-CM | POA: Diagnosis not present

## 2022-01-25 DIAGNOSIS — H524 Presbyopia: Secondary | ICD-10-CM | POA: Diagnosis not present

## 2022-01-25 DIAGNOSIS — E119 Type 2 diabetes mellitus without complications: Secondary | ICD-10-CM | POA: Diagnosis not present

## 2022-04-01 DIAGNOSIS — E1169 Type 2 diabetes mellitus with other specified complication: Secondary | ICD-10-CM | POA: Diagnosis not present

## 2022-04-01 DIAGNOSIS — I10 Essential (primary) hypertension: Secondary | ICD-10-CM | POA: Diagnosis not present

## 2022-04-01 DIAGNOSIS — R1084 Generalized abdominal pain: Secondary | ICD-10-CM | POA: Diagnosis not present

## 2022-04-01 DIAGNOSIS — E782 Mixed hyperlipidemia: Secondary | ICD-10-CM | POA: Diagnosis not present

## 2022-04-01 DIAGNOSIS — Z6834 Body mass index (BMI) 34.0-34.9, adult: Secondary | ICD-10-CM | POA: Diagnosis not present

## 2022-08-23 ENCOUNTER — Other Ambulatory Visit: Payer: Self-pay | Admitting: Family Medicine

## 2022-08-23 DIAGNOSIS — Z1231 Encounter for screening mammogram for malignant neoplasm of breast: Secondary | ICD-10-CM

## 2022-09-09 ENCOUNTER — Ambulatory Visit
Admission: RE | Admit: 2022-09-09 | Discharge: 2022-09-09 | Disposition: A | Discharge status: Home / Self Care | Payer: Medicare PPO | Source: Ambulatory Visit | Attending: Family Medicine | Admitting: Family Medicine

## 2022-09-09 DIAGNOSIS — Z1231 Encounter for screening mammogram for malignant neoplasm of breast: Secondary | ICD-10-CM

## 2022-09-25 IMAGING — MG MM DIGITAL SCREENING BILAT W/ TOMO AND CAD
8 series · 8 of 24 positions shown · non-contrast
Comparison: Previous exam(s).

CLINICAL DATA: Screening.

EXAM:
DIGITAL SCREENING BILATERAL MAMMOGRAM WITH TOMOSYNTHESIS AND CAD
TECHNIQUE: Bilateral screening digital craniocaudal and mediolateral oblique
mammograms were obtained. Bilateral screening digital breast
tomosynthesis was performed. The images were evaluated with
computer-aided detection.

[R CC synth-2D]
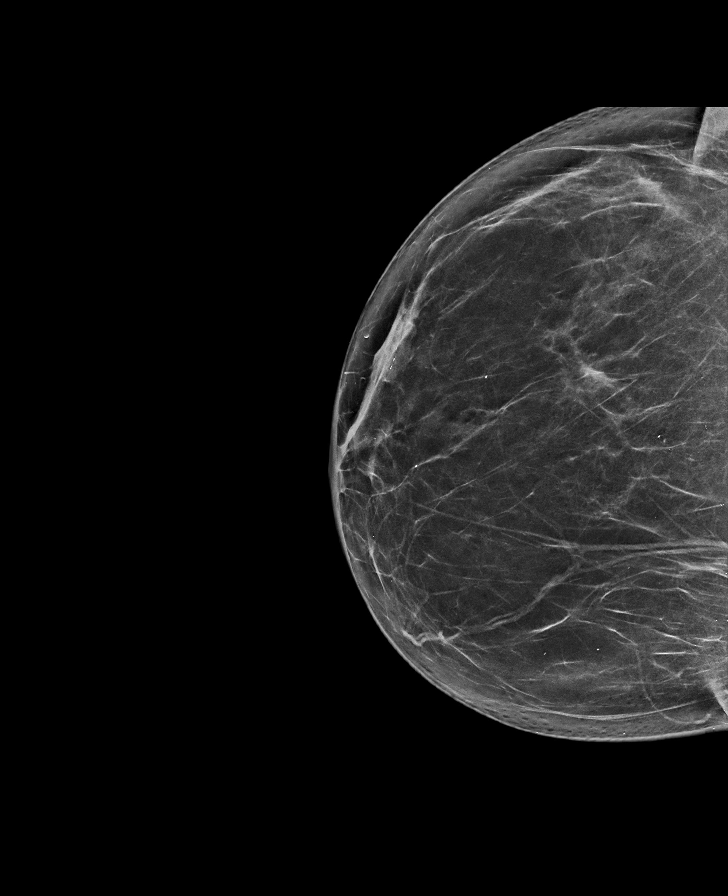

[L CC synth-2D]
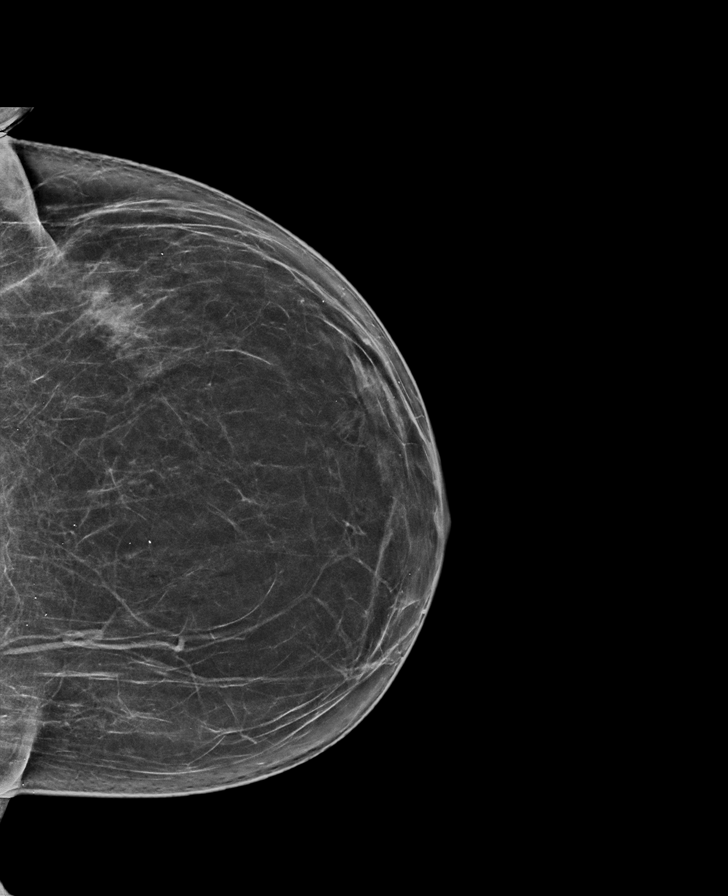

[L MLO synth-2D]
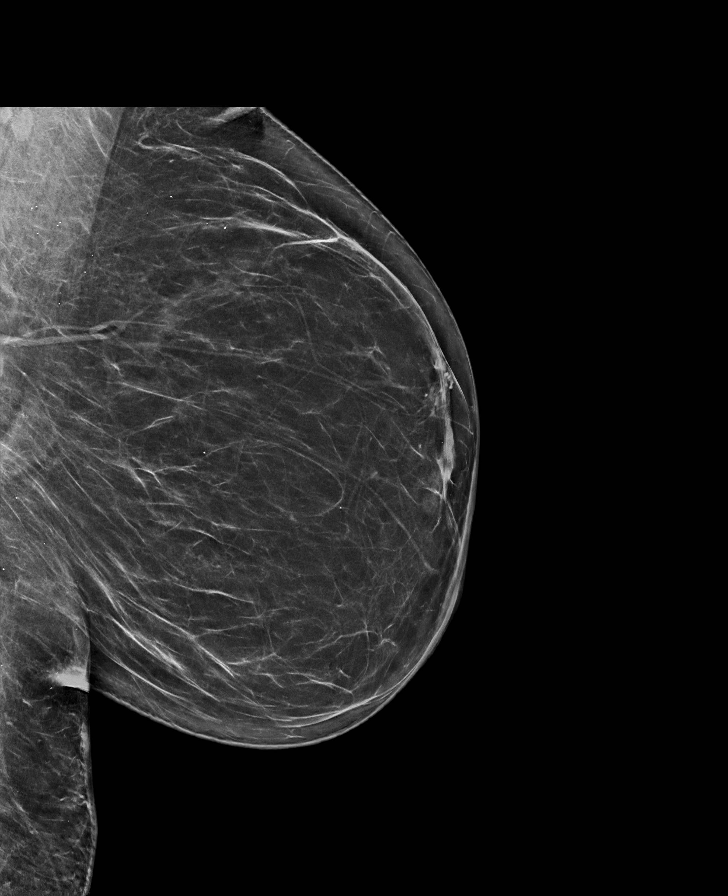

[R MLO synth-2D]
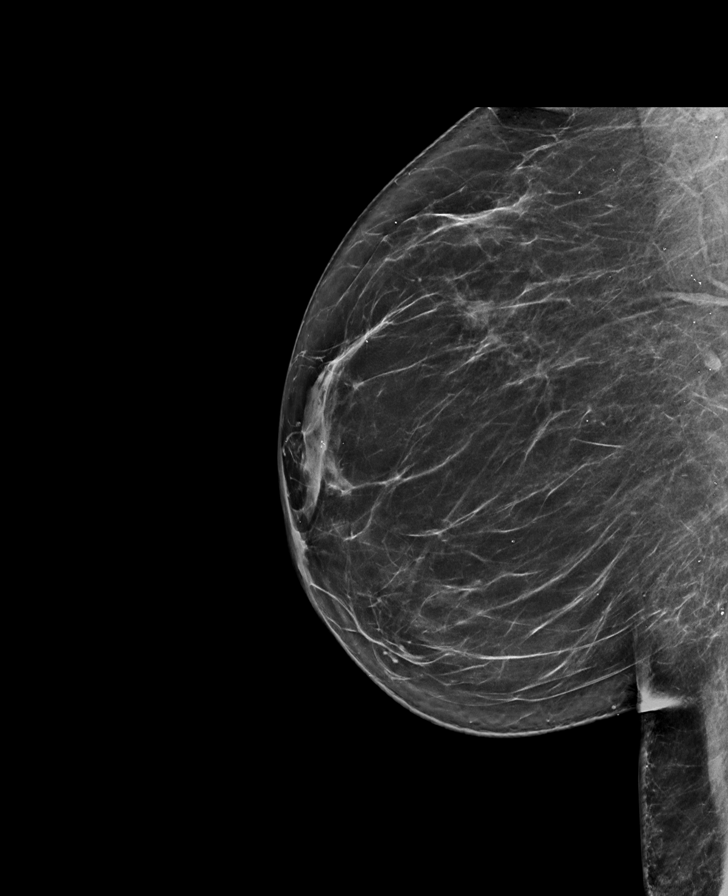

[L CC tomo · tomo slice 34/67.0]
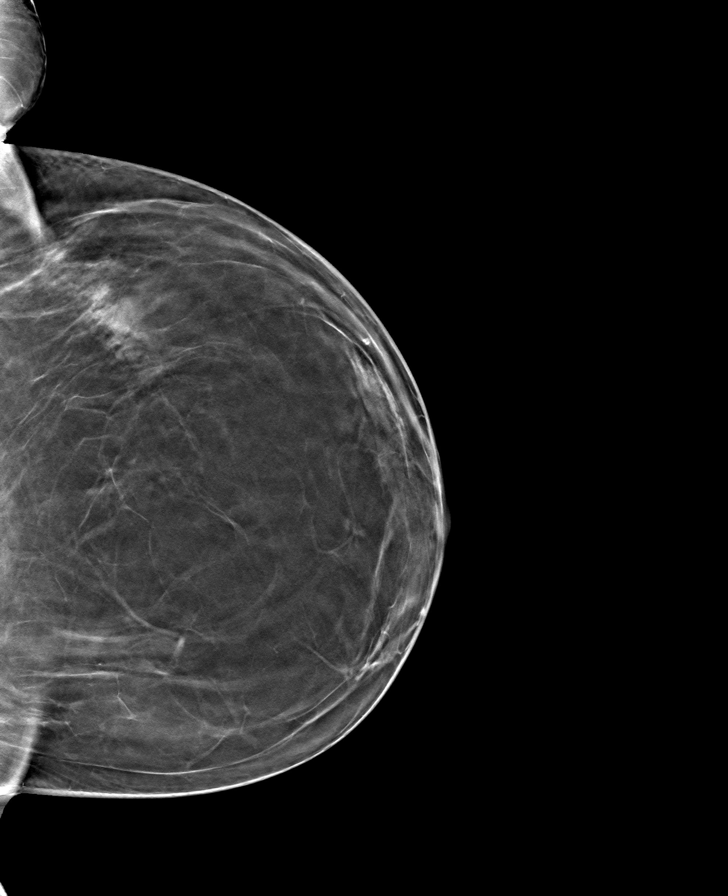

[R MLO tomo · tomo slice 37/74.0]
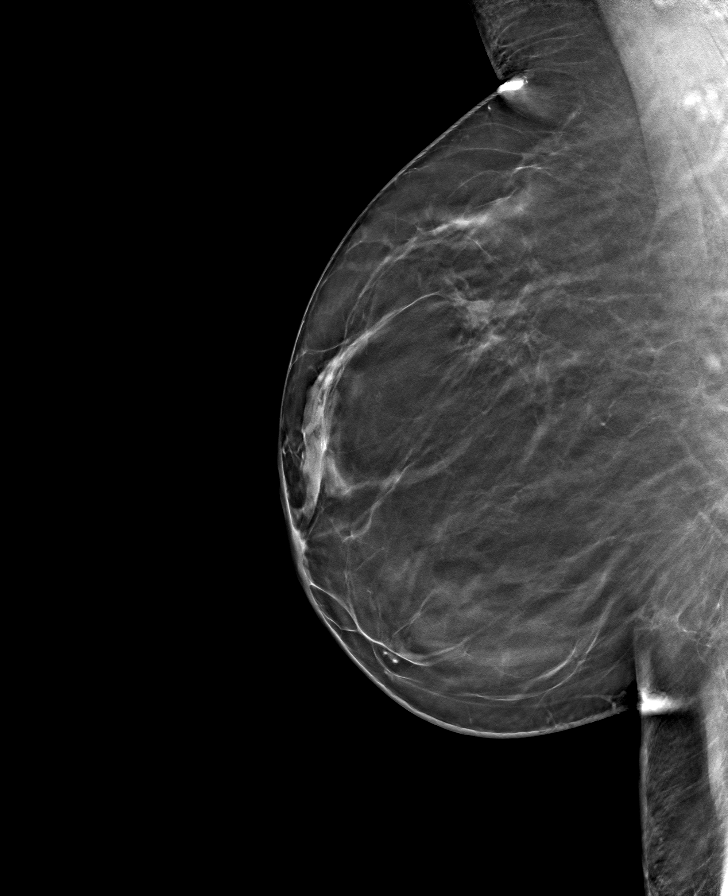

[R CC tomo · tomo slice 36/71.0]
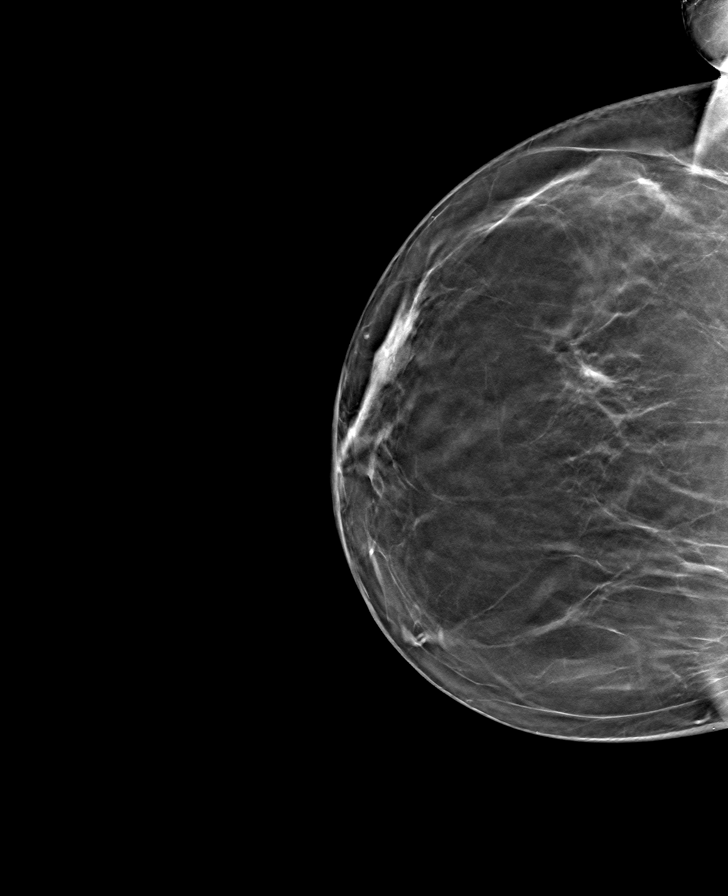

[L MLO tomo · tomo slice 35/70.0]
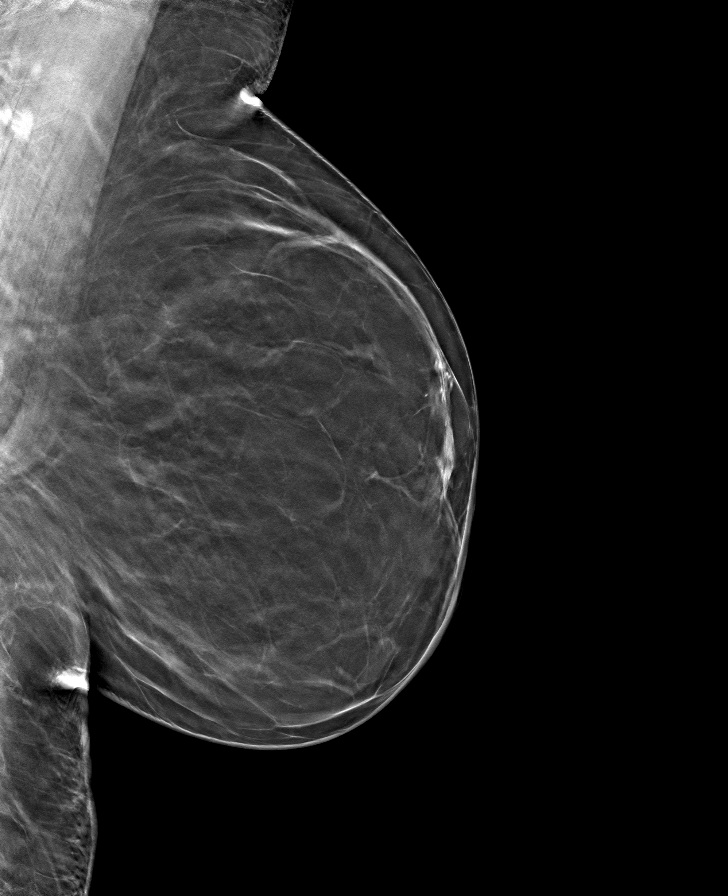

[8 of 24 positions shown; findings below may reference images not displayed]

ACR Breast Density Category b: There are scattered areas of
fibroglandular density.
FINDINGS: There are no findings suspicious for malignancy.
IMPRESSION: No mammographic evidence of malignancy. A result letter of this
screening mammogram will be mailed directly to the patient.

RECOMMENDATION:
Screening mammogram in one year. (Code:51-O-LD2)

BI-RADS CATEGORY  1: Negative.

## 2022-10-07 DIAGNOSIS — Z6833 Body mass index (BMI) 33.0-33.9, adult: Secondary | ICD-10-CM | POA: Diagnosis not present

## 2022-10-07 DIAGNOSIS — E782 Mixed hyperlipidemia: Secondary | ICD-10-CM | POA: Diagnosis not present

## 2022-10-07 DIAGNOSIS — E1169 Type 2 diabetes mellitus with other specified complication: Secondary | ICD-10-CM | POA: Diagnosis not present

## 2022-10-07 DIAGNOSIS — I10 Essential (primary) hypertension: Secondary | ICD-10-CM | POA: Diagnosis not present

## 2022-10-07 DIAGNOSIS — Z23 Encounter for immunization: Secondary | ICD-10-CM | POA: Diagnosis not present

## 2023-02-03 DIAGNOSIS — H524 Presbyopia: Secondary | ICD-10-CM | POA: Diagnosis not present

## 2023-02-03 DIAGNOSIS — H2513 Age-related nuclear cataract, bilateral: Secondary | ICD-10-CM | POA: Diagnosis not present

## 2023-02-03 DIAGNOSIS — E119 Type 2 diabetes mellitus without complications: Secondary | ICD-10-CM | POA: Diagnosis not present

## 2023-04-01 DIAGNOSIS — E782 Mixed hyperlipidemia: Secondary | ICD-10-CM | POA: Diagnosis not present

## 2023-04-01 DIAGNOSIS — E1165 Type 2 diabetes mellitus with hyperglycemia: Secondary | ICD-10-CM | POA: Diagnosis not present

## 2023-04-01 DIAGNOSIS — Z6836 Body mass index (BMI) 36.0-36.9, adult: Secondary | ICD-10-CM | POA: Diagnosis not present

## 2023-04-01 DIAGNOSIS — I10 Essential (primary) hypertension: Secondary | ICD-10-CM | POA: Diagnosis not present

## 2023-07-09 DIAGNOSIS — E1169 Type 2 diabetes mellitus with other specified complication: Secondary | ICD-10-CM | POA: Diagnosis not present

## 2023-10-06 DIAGNOSIS — E119 Type 2 diabetes mellitus without complications: Secondary | ICD-10-CM | POA: Diagnosis not present

## 2023-10-06 DIAGNOSIS — R946 Abnormal results of thyroid function studies: Secondary | ICD-10-CM | POA: Diagnosis not present

## 2023-10-06 DIAGNOSIS — Z79899 Other long term (current) drug therapy: Secondary | ICD-10-CM | POA: Diagnosis not present

## 2023-10-06 DIAGNOSIS — M8580 Other specified disorders of bone density and structure, unspecified site: Secondary | ICD-10-CM | POA: Diagnosis not present

## 2023-10-06 DIAGNOSIS — D649 Anemia, unspecified: Secondary | ICD-10-CM | POA: Diagnosis not present

## 2023-10-06 DIAGNOSIS — I10 Essential (primary) hypertension: Secondary | ICD-10-CM | POA: Diagnosis not present

## 2023-10-06 DIAGNOSIS — R7989 Other specified abnormal findings of blood chemistry: Secondary | ICD-10-CM | POA: Diagnosis not present

## 2023-10-06 DIAGNOSIS — E782 Mixed hyperlipidemia: Secondary | ICD-10-CM | POA: Diagnosis not present

## 2023-10-06 DIAGNOSIS — E1165 Type 2 diabetes mellitus with hyperglycemia: Secondary | ICD-10-CM | POA: Diagnosis not present

## 2023-10-08 ENCOUNTER — Other Ambulatory Visit: Payer: Self-pay | Admitting: Family Medicine

## 2023-10-08 DIAGNOSIS — Z6836 Body mass index (BMI) 36.0-36.9, adult: Secondary | ICD-10-CM | POA: Diagnosis not present

## 2023-10-08 DIAGNOSIS — Z1231 Encounter for screening mammogram for malignant neoplasm of breast: Secondary | ICD-10-CM

## 2023-10-08 DIAGNOSIS — Z0000 Encounter for general adult medical examination without abnormal findings: Secondary | ICD-10-CM | POA: Diagnosis not present

## 2023-10-08 DIAGNOSIS — Z23 Encounter for immunization: Secondary | ICD-10-CM | POA: Diagnosis not present

## 2023-10-08 DIAGNOSIS — Z1331 Encounter for screening for depression: Secondary | ICD-10-CM | POA: Diagnosis not present

## 2023-11-04 ENCOUNTER — Ambulatory Visit
Admission: RE | Admit: 2023-11-04 | Discharge: 2023-11-04 | Disposition: A | Discharge status: Home / Self Care | Payer: Medicare PPO | Source: Ambulatory Visit | Attending: Family Medicine | Admitting: Family Medicine

## 2023-11-04 DIAGNOSIS — Z1231 Encounter for screening mammogram for malignant neoplasm of breast: Secondary | ICD-10-CM | POA: Diagnosis not present

## 2023-11-14 DIAGNOSIS — M5441 Lumbago with sciatica, right side: Secondary | ICD-10-CM | POA: Diagnosis not present

## 2023-11-17 DIAGNOSIS — M545 Low back pain, unspecified: Secondary | ICD-10-CM | POA: Diagnosis not present

## 2023-12-09 DIAGNOSIS — M545 Low back pain, unspecified: Secondary | ICD-10-CM | POA: Diagnosis not present

## 2024-02-16 DIAGNOSIS — H524 Presbyopia: Secondary | ICD-10-CM | POA: Diagnosis not present

## 2024-02-16 DIAGNOSIS — E119 Type 2 diabetes mellitus without complications: Secondary | ICD-10-CM | POA: Diagnosis not present

## 2024-02-16 DIAGNOSIS — H2513 Age-related nuclear cataract, bilateral: Secondary | ICD-10-CM | POA: Diagnosis not present

## 2024-03-31 DIAGNOSIS — E119 Type 2 diabetes mellitus without complications: Secondary | ICD-10-CM | POA: Diagnosis not present

## 2024-03-31 DIAGNOSIS — I10 Essential (primary) hypertension: Secondary | ICD-10-CM | POA: Diagnosis not present

## 2024-03-31 DIAGNOSIS — E782 Mixed hyperlipidemia: Secondary | ICD-10-CM | POA: Diagnosis not present

## 2024-05-13 DIAGNOSIS — M272 Inflammatory conditions of jaws: Secondary | ICD-10-CM | POA: Diagnosis not present

## 2024-06-03 DIAGNOSIS — L920 Granuloma annulare: Secondary | ICD-10-CM | POA: Diagnosis not present

## 2024-06-03 DIAGNOSIS — D485 Neoplasm of uncertain behavior of skin: Secondary | ICD-10-CM | POA: Diagnosis not present

## 2024-07-22 DIAGNOSIS — L920 Granuloma annulare: Secondary | ICD-10-CM | POA: Diagnosis not present

## 2024-10-12 DIAGNOSIS — E66811 Obesity, class 1: Secondary | ICD-10-CM | POA: Diagnosis not present

## 2024-10-12 DIAGNOSIS — I10 Essential (primary) hypertension: Secondary | ICD-10-CM | POA: Diagnosis not present

## 2024-10-12 DIAGNOSIS — Z0000 Encounter for general adult medical examination without abnormal findings: Secondary | ICD-10-CM | POA: Diagnosis not present

## 2024-10-12 DIAGNOSIS — Z1331 Encounter for screening for depression: Secondary | ICD-10-CM | POA: Diagnosis not present

## 2024-10-12 DIAGNOSIS — E119 Type 2 diabetes mellitus without complications: Secondary | ICD-10-CM | POA: Diagnosis not present

## 2024-10-12 DIAGNOSIS — L920 Granuloma annulare: Secondary | ICD-10-CM | POA: Diagnosis not present

## 2024-10-12 DIAGNOSIS — E782 Mixed hyperlipidemia: Secondary | ICD-10-CM | POA: Diagnosis not present

## 2024-12-09 ENCOUNTER — Other Ambulatory Visit: Payer: Self-pay | Admitting: Family Medicine

## 2024-12-09 DIAGNOSIS — Z1231 Encounter for screening mammogram for malignant neoplasm of breast: Secondary | ICD-10-CM

## 2024-12-22 ENCOUNTER — Ambulatory Visit
Admission: RE | Admit: 2024-12-22 | Discharge: 2024-12-22 | Disposition: A | Source: Ambulatory Visit | Attending: Family Medicine | Admitting: Family Medicine

## 2024-12-22 DIAGNOSIS — Z1231 Encounter for screening mammogram for malignant neoplasm of breast: Secondary | ICD-10-CM
